# Patient Record
Sex: Female | Born: 1975
Health system: Southern US, Community
[De-identification: ages and names within clinical notes are randomized; demographics above are authoritative.]

## PROBLEM LIST (undated history)

## (undated) DIAGNOSIS — E119 Type 2 diabetes mellitus without complications: Secondary | ICD-10-CM

## (undated) DIAGNOSIS — K862 Cyst of pancreas: Secondary | ICD-10-CM

## (undated) HISTORY — PX: ABDOMINAL SURGERY: SHX537

## (undated) HISTORY — PX: PANCREATIC CYST EXCISION: SHX2157

---

## 2007-04-17 ENCOUNTER — Emergency Department (HOSPITAL_COMMUNITY): Admission: EM | Admit: 2007-04-17 | Discharge: 2007-04-17 | Payer: Self-pay | Admitting: Certified Registered"

## 2015-03-31 ENCOUNTER — Encounter (HOSPITAL_BASED_OUTPATIENT_CLINIC_OR_DEPARTMENT_OTHER): Payer: Self-pay | Admitting: Emergency Medicine

## 2015-03-31 ENCOUNTER — Emergency Department (HOSPITAL_BASED_OUTPATIENT_CLINIC_OR_DEPARTMENT_OTHER)
Admission: EM | Admit: 2015-03-31 | Discharge: 2015-03-31 | Disposition: A | Discharge status: Home / Self Care | Payer: BLUE CROSS/BLUE SHIELD | Attending: Emergency Medicine | Admitting: Emergency Medicine

## 2015-03-31 DIAGNOSIS — Y998 Other external cause status: Secondary | ICD-10-CM | POA: Insufficient documentation

## 2015-03-31 DIAGNOSIS — Y9389 Activity, other specified: Secondary | ICD-10-CM | POA: Diagnosis not present

## 2015-03-31 DIAGNOSIS — E119 Type 2 diabetes mellitus without complications: Secondary | ICD-10-CM | POA: Insufficient documentation

## 2015-03-31 DIAGNOSIS — Y92410 Unspecified street and highway as the place of occurrence of the external cause: Secondary | ICD-10-CM | POA: Insufficient documentation

## 2015-03-31 DIAGNOSIS — S4992XA Unspecified injury of left shoulder and upper arm, initial encounter: Secondary | ICD-10-CM

## 2015-03-31 DIAGNOSIS — Z7984 Long term (current) use of oral hypoglycemic drugs: Secondary | ICD-10-CM | POA: Insufficient documentation

## 2015-03-31 DIAGNOSIS — S79912A Unspecified injury of left hip, initial encounter: Secondary | ICD-10-CM | POA: Diagnosis not present

## 2015-03-31 HISTORY — DX: Type 2 diabetes mellitus without complications: E11.9

## 2015-03-31 MED ORDER — NAPROXEN 375 MG PO TABS: 375.0000 mg | ORAL_TABLET | Freq: Two times a day (BID) | ORAL | Status: DC

## 2015-03-31 MED ORDER — CYCLOBENZAPRINE HCL 10 MG PO TABS: 10.0000 mg | ORAL_TABLET | Freq: Two times a day (BID) | ORAL | Status: DC | PRN

## 2015-03-31 MED ORDER — IBUPROFEN 800 MG PO TABS
ORAL_TABLET | ORAL | Status: AC
  Administered 2015-03-31: 800 mg via ORAL
  Filled 2015-03-31: qty 1

## 2015-03-31 MED ORDER — IBUPROFEN 800 MG PO TABS
800.0000 mg | ORAL_TABLET | Freq: Once | ORAL | Status: AC
  Administered 2015-03-31: 800 mg via ORAL

## 2015-03-31 MED FILL — CYCLOBENZAPRINE 10 MG TAB: 10 | 10 days supply | Qty: 20 | Fill #0

## 2015-03-31 MED FILL — NAPROXEN 375 MG TABLET: 375 | 10 days supply | Qty: 20 | Fill #0
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
## 2015-03-31 NOTE — Discharge Instructions (Signed)
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
## 2015-12-14 ENCOUNTER — Emergency Department (HOSPITAL_BASED_OUTPATIENT_CLINIC_OR_DEPARTMENT_OTHER)
Admission: EM | Admit: 2015-12-14 | Discharge: 2015-12-14 | Disposition: A | Discharge status: Home / Self Care | Payer: BLUE CROSS/BLUE SHIELD | Attending: Emergency Medicine | Admitting: Emergency Medicine

## 2015-12-14 ENCOUNTER — Encounter (HOSPITAL_BASED_OUTPATIENT_CLINIC_OR_DEPARTMENT_OTHER): Payer: Self-pay | Admitting: *Deleted

## 2015-12-14 DIAGNOSIS — E119 Type 2 diabetes mellitus without complications: Secondary | ICD-10-CM | POA: Insufficient documentation

## 2015-12-14 DIAGNOSIS — M79602 Pain in left arm: Secondary | ICD-10-CM

## 2015-12-14 DIAGNOSIS — Z791 Long term (current) use of non-steroidal anti-inflammatories (NSAID): Secondary | ICD-10-CM | POA: Insufficient documentation

## 2015-12-14 DIAGNOSIS — Z7984 Long term (current) use of oral hypoglycemic drugs: Secondary | ICD-10-CM | POA: Insufficient documentation

## 2015-12-14 LAB — BASIC METABOLIC PANEL
Anion gap: 6 (ref 5–15)
BUN: 9 mg/dL (ref 6–20)
CHLORIDE: 104 mmol/L (ref 101–111)
CO2: 25 mmol/L (ref 22–32)
CREATININE: 0.58 mg/dL (ref 0.44–1.00)
Calcium: 9 mg/dL (ref 8.9–10.3)
GFR calc non Af Amer: >60 mL/min (ref >60)
Glucose, Bld: 152 mg/dL — ABNORMAL HIGH (ref 65–99)
POTASSIUM: 4.3 mmol/L (ref 3.5–5.1)
SODIUM: 135 mmol/L (ref 135–145)

## 2015-12-14 LAB — TROPONIN I

## 2015-12-14 LAB — MAGNESIUM: MAGNESIUM: 1.9 mg/dL (ref 1.7–2.4)

## 2015-12-14 LAB — HCG, QUANTITATIVE, PREGNANCY: hCG, Beta Chain, Quant, S: <1 m[IU]/mL (ref <5)

## 2015-12-14 MED ORDER — METHOCARBAMOL 500 MG PO TABS: 500.0000 mg | ORAL_TABLET | Freq: Two times a day (BID) | ORAL | 0 refills | Status: AC

## 2015-12-14 NOTE — ED Provider Notes (Signed)
Emergency Department Provider Note  By signing my name below, I, Sabrina Miles, attest that this documentation has been prepared under the direction and in the presence of Sabrina PlanJoshua G Elder Davidian, Miles . Electronically Signed: Nelwyn SalisburyJoshua Miles, Scribe. 12/14/2015. 6:17 PM.   I have reviewed the triage vital signs and the nursing notes.   HISTORY  Chief Complaint Arm Pain   HPI Sabrina Miles is a 40 y.o. female with pmhx of DM who presents to the Emergency Department complaining of sudden-onset gradually worsening intermittent left arm pain beginning 3 days ago. She describes her symptoms as a "spasming" pain that radiates from her left shoulder down to her left hand. When asked about the timing of her symptoms, she states that it has worsened today and occurs every 10 minutes. Pt reports associated tingling in her left fingertips. She denies any CP, SOB, changes in appetite, dysuria, trouble urinating, diarrhea, vomiting, neck pain, abdominal pain or numbness.  Past Medical History:  Diagnosis Date  . Diabetes mellitus without complication (HCC)     There are no active problems to display for this patient.   Past Surgical History:  Procedure Laterality Date  . ABDOMINAL SURGERY      Current Outpatient Rx  . Order #: [id number] Class: Print  . Order #: [id number] Class: Historical Med  . Order #: [id number] Class: Print  . Order #: [id number] Class: Print    Allergies Shellfish allergy  No family history on file.  Social History Social History  Substance Use Topics  . Smoking status: Never Smoker  . Smokeless tobacco: Never Used  . Alcohol use No    Review of Systems Constitutional: No fever/chills. No change in appetite. Eyes: No visual changes. ENT: No sore throat. Cardiovascular: Denies chest pain. Respiratory: Denies shortness of breath. Gastrointestinal: No abdominal pain.  No nausea, no vomiting.  No diarrhea.  No constipation.  Genitourinary: Negative for dysuria. Negative for  trouble urinating. Musculoskeletal: Positive myalgia (left arm). Negative for back pain. Skin: Negative for rash. Neurological:Positive tingling. Negative for neck pain. Negative for headaches, focal weakness or numbness. 10-point ROS otherwise negative.  ____________________________________________   PHYSICAL EXAM:  VITAL SIGNS: ED Triage Vitals  Enc Vitals Group     BP 12/14/15 1648 160/70     Pulse Rate 12/14/15 1648 90     Resp 12/14/15 1648 18     Temp 12/14/15 1648 98.1 F (36.7 C)     Temp Source 12/14/15 1648 Oral     SpO2 12/14/15 1648 100 %     Weight 12/14/15 1642 165 lb (74.8 kg)     Height 12/14/15 1642 5\' 8"  (1.727 m)     Pain Score 12/14/15 1643 10   Constitutional: Alert and oriented. Well appearing and in no acute distress. Eyes: Conjunctivae are normal.  Head: Atraumatic. Nose: No congestion/rhinnorhea. Mouth/Throat: Mucous membranes are moist.  Neck: No stridor. No cervical spine tenderness to palpation. Cardiovascular: Normal rate, regular rhythm. Good peripheral circulation. Grossly normal heart sounds.   Respiratory: Normal respiratory effort.  No retractions. Lungs CTAB. Gastrointestinal: Soft and nontender. No distention.  Musculoskeletal: No lower extremity tenderness nor edema. No gross deformities of extremities. Neurologic:  Normal speech and language. No gross focal neurologic deficits are appreciated.  Skin:  Skin is warm, dry and intact. No rash noted.   ____________________________________________   LABS (all labs ordered are listed, but only abnormal results are displayed)  Labs Reviewed  BASIC METABOLIC PANEL - Abnormal; Notable for the following:  Result Value   Glucose, Bld 152 (*)    All other components within normal limits  MAGNESIUM  TROPONIN I  HCG, QUANTITATIVE, PREGNANCY   ____________________________________________  EKG   EKG Interpretation  Date/Time:  Tuesday December 14 2015 16:48:08 EST Ventricular Rate:   97 PR Interval:  126 QRS Duration: 64 QT Interval:  338 QTC Calculation: 429 R Axis:   62 Text Interpretation:  Normal sinus rhythm Normal ECG No STEMI.  Confirmed by Sabrina Miles, Sabrina Miles [phone]) on 12/14/2015 4:50:42 PM Also confirmed by Sabrina Miles, Sabrina Miles [phone]), editor Wandalee FerdinandLOGAN, KIMBERLY [phone])  on 12/14/2015 5:01:12 PM       ____________________________________________   PROCEDURES  Procedure(s) performed:   Procedures  None ____________________________________________   INITIAL IMPRESSION / ASSESSMENT AND PLAN / ED COURSE  Pertinent labs & imaging results that were available during my care of the patient were reviewed by me and considered in my medical decision making (see chart for details).  Patient presents to the ED for evaluation of left arm spasm pain. Normal kidney function and electrolytes. No neurological deficits or vision changes to suggest new onset neurologic pathology (MS). Low suspicion for ACS with no CP or other anginal equivalents. EKG reviewed. No cervical spine injury or tenderness on exam. Plan for symptomatic treatment at home and PCP follow up.   At this time, I do not feel there is any life-threatening condition present. I have reviewed and discussed all results (EKG, imaging, lab, urine as appropriate), exam findings with patient. I have reviewed nursing notes and appropriate previous records.  I feel the patient is safe to be discharged home without further emergent workup. Discussed usual and customary return precautions. Patient and family (if present) verbalize understanding and are comfortable with this plan.  Patient will follow-up with their primary care provider. If they do not have a primary care provider, information for follow-up has been provided to them. All questions have been answered.  ____________________________________________  FINAL CLINICAL IMPRESSION(S) / ED DIAGNOSES  Final diagnoses:  Left arm pain     MEDICATIONS GIVEN DURING THIS  VISIT:  None  NEW OUTPATIENT MEDICATIONS STARTED DURING THIS VISIT:  Discharge Medication List as of 12/14/2015  7:47 PM    START taking these medications   Details  methocarbamol (ROBAXIN) 500 MG tablet Take 1 tablet (500 mg total) by mouth 2 (two) times daily., Starting Tue 12/14/2015, Print        Note:  This document was prepared using Dragon voice recognition software and may include unintentional dictation errors.  Alona BeneJoshua Lajuan Godbee, [facility]  I personally performed the services described in this documentation, which was scribed in my presence. The recorded information has been reviewed and is accurate.       Sabrina PlanJoshua G Riven Beebe, Miles 12/15/15 [phone]

## 2015-12-14 NOTE — Discharge Instructions (Signed)

## 2015-12-14 NOTE — ED Triage Notes (Signed)
crampy arm pain in her left arm x 3 days.

## 2017-01-25 ENCOUNTER — Emergency Department (HOSPITAL_BASED_OUTPATIENT_CLINIC_OR_DEPARTMENT_OTHER): Payer: 59

## 2017-01-25 ENCOUNTER — Other Ambulatory Visit: Payer: Self-pay

## 2017-01-25 ENCOUNTER — Emergency Department (HOSPITAL_BASED_OUTPATIENT_CLINIC_OR_DEPARTMENT_OTHER)
Admission: EM | Admit: 2017-01-25 | Discharge: 2017-01-25 | Disposition: A | Discharge status: Home / Self Care | Payer: 59 | Attending: Emergency Medicine | Admitting: Emergency Medicine

## 2017-01-25 ENCOUNTER — Encounter (HOSPITAL_BASED_OUTPATIENT_CLINIC_OR_DEPARTMENT_OTHER): Payer: Self-pay | Admitting: *Deleted

## 2017-01-25 DIAGNOSIS — M79602 Pain in left arm: Secondary | ICD-10-CM | POA: Diagnosis not present

## 2017-01-25 DIAGNOSIS — Z7984 Long term (current) use of oral hypoglycemic drugs: Secondary | ICD-10-CM | POA: Diagnosis not present

## 2017-01-25 DIAGNOSIS — E119 Type 2 diabetes mellitus without complications: Secondary | ICD-10-CM | POA: Insufficient documentation

## 2017-01-25 DIAGNOSIS — M25512 Pain in left shoulder: Secondary | ICD-10-CM | POA: Insufficient documentation

## 2017-01-25 DIAGNOSIS — R2232 Localized swelling, mass and lump, left upper limb: Secondary | ICD-10-CM | POA: Insufficient documentation

## 2017-01-25 DIAGNOSIS — Z79899 Other long term (current) drug therapy: Secondary | ICD-10-CM | POA: Diagnosis not present

## 2017-01-25 LAB — BASIC METABOLIC PANEL
Anion gap: 9 (ref 5–15)
BUN: 12 mg/dL (ref 6–20)
CALCIUM: 9.1 mg/dL (ref 8.9–10.3)
CO2: 24 mmol/L (ref 22–32)
Chloride: 99 mmol/L — ABNORMAL LOW (ref 101–111)
Creatinine, Ser: 0.71 mg/dL (ref 0.44–1.00)
GFR calc Af Amer: >60 mL/min (ref >60)
Glucose, Bld: 100 mg/dL — ABNORMAL HIGH (ref 65–99)
Potassium: 3.7 mmol/L (ref 3.5–5.1)
SODIUM: 132 mmol/L — AB (ref 135–145)

## 2017-01-25 LAB — CBG MONITORING, ED: Glucose-Capillary: 98 mg/dL (ref 65–99)

## 2017-01-25 LAB — CBC
HCT: 30.9 % — ABNORMAL LOW (ref 36.0–46.0)
Hemoglobin: 10.3 g/dL — ABNORMAL LOW (ref 12.0–15.0)
MCH: 27 pg (ref 26.0–34.0)
MCHC: 33.3 g/dL (ref 30.0–36.0)
MCV: 80.9 fL (ref 78.0–100.0)
PLATELETS: 507 10*3/uL — AB (ref 150–400)
RBC: 3.82 MIL/uL — AB (ref 3.87–5.11)
RDW: 16.3 % — AB (ref 11.5–15.5)
WBC: 10.5 10*3/uL (ref 4.0–10.5)

## 2017-01-25 LAB — PREGNANCY, URINE: PREG TEST UR: NEGATIVE

## 2017-01-25 MED ORDER — IOPAMIDOL (ISOVUE-370) INJECTION 76%
125.0000 mL | Freq: Once | INTRAVENOUS | Status: AC | PRN
  Administered 2017-01-25: 125 mL via INTRAVENOUS

## 2017-01-25 MED ORDER — NAPROXEN 375 MG PO TABS: 375.0000 mg | ORAL_TABLET | Freq: Two times a day (BID) | ORAL | 0 refills | Status: AC

## 2017-01-25 NOTE — ED Notes (Signed)
Pt in CT at this time.

## 2017-01-25 NOTE — Discharge Instructions (Signed)
Contact a health care provider if: °Your pain is getting worse. °Your pain is not relieved with medicines. °You lose function in the area of the pain if the pain is in your arms, legs, or neck. °

## 2017-01-25 NOTE — ED Notes (Signed)
Pt returned from CT °

## 2017-01-25 NOTE — ED Provider Notes (Signed)
[facility] EMERGENCY DEPARTMENT Provider Note   CSN: [id number] Arrival date & time: 01/25/17  1421     History   Chief Complaint Chief Complaint  Patient presents with  . Arm Pain    HPI Sabrina Miles Service is a 42 y.o. female.who presents with c/o left arm pain. Onset began 1 week ago. She had some pain in her left neck and shoulder that seemed to go away through the day. Later her arm began to ache and she feels as though the pain is radiating from her shoulder blade to her hand. It seems to come and go. It has been progressively worsening throughout the week.  Does not seem to be worse with any movement.  It seems to come and go and radiate up and down her arm.  She has noticed over the past couple days swelling and through her visit here in the ED her left hand has become pale cool and white.  She states that the pain is severe she denies any history of clotting disorders.  She did not take any exogenous estrogens.  She does not smoke she has not had no recent traumas, surgeries or long periods of confinement.  Patient also denies chest pain or shortness of breath she has not had any injuries to the left upper extremity or neck.  She denies numbness or tingling in the left upper extremity or hand  HPI  Past Medical History:  Diagnosis Date  . Diabetes mellitus without complication (HCC)     There are no active problems to display for this patient.   Past Surgical History:  Procedure Laterality Date  . ABDOMINAL SURGERY      OB History    No data available       Home Medications    Prior to Admission medications   Medication Sig Start Date End Date Taking? Authorizing Provider  cyclobenzaprine (FLEXERIL) 10 MG tablet Take 1 tablet (10 mg total) by mouth 2 (two) times daily as needed for muscle spasms. 03/31/15   Arthor CaptainHarris, Gionni Freese, PA-C  metFORMIN (GLUCOPHAGE) 500 MG tablet Take 500 mg by mouth 2 (two) times daily with a meal.    [provider]    methocarbamol (ROBAXIN) 500 MG tablet Take 1 tablet (500 mg total) by mouth 2 (two) times daily. 12/14/15   Long, Arlyss RepressJoshua G, MD  naproxen (NAPROSYN) 375 MG tablet Take 1 tablet (375 mg total) by mouth 2 (two) times daily. 03/31/15   Arthor CaptainHarris, Madai Nuccio, PA-C    Family History History reviewed. No pertinent family history.  Social History Social History   Tobacco Use  . Smoking status: Never Smoker  . Smokeless tobacco: Never Used  Substance Use Topics  . Alcohol use: No  . Drug use: No     Allergies   Shellfish allergy   Review of Systems Review of Systems Ten systems reviewed and are negative for acute change, except as noted in the HPI.    Physical Exam Updated Vital Signs BP 134/79   Pulse 80   Temp 98.4 F (36.9 C)   Resp 16   Ht 5' 7.5" (1.715 m)   Wt 74.8 kg (165 lb)   LMP 01/23/2017   SpO2 100%   BMI 25.46 kg/m   Physical Exam  Constitutional: She is oriented to person, place, and time. She appears well-developed and well-nourished. No distress.  HENT:  Head: Normocephalic and atraumatic.  Eyes: Conjunctivae are normal. No scleral icterus.  Neck: Normal range of motion.  Cardiovascular: Normal rate, regular rhythm and normal heart sounds. Exam reveals no gallop and no friction rub.  No murmur heard. Pulmonary/Chest: Effort normal and breath sounds normal. No respiratory distress.  Abdominal: Soft. Bowel sounds are normal. She exhibits no distension and no mass. There is no tenderness. There is no guarding.  Musculoskeletal:  Tenderness and swelling in the left bicep, trigger points in the left infraspinatus reproduce pain that radiates down the arm.  She also has tenderness and tightness in the left trapezius.  Normal range of motion of the left upper extremity.  The left bicep is swollen as compared to the right and her left hand is markedly pale with decreased capillary refill, extremely cool to touch comparatively to her right hand.  She has normal bilateral  radial pulses  Neurological: She is alert and oriented to person, place, and time.  Skin: Skin is warm and dry. She is not diaphoretic.  Psychiatric: Her behavior is normal.  Nursing note and vitals reviewed.    ED Treatments / Results  Labs (all labs ordered are listed, but only abnormal results are displayed) Labs Reviewed  CBC  BASIC METABOLIC PANEL  PREGNANCY, URINE    EKG  EKG Interpretation None       Radiology No results found.  Procedures Procedures (including critical care time)  Medications Ordered in ED Medications - No data to display   Initial Impression / Assessment and Plan / ED Course  I have reviewed the triage vital signs and the nursing notes.  Pertinent labs & imaging results that were available during my care of the patient were reviewed by me and considered in my medical decision making (see chart for details).     Patient's imaging negative for any acute abnormality. The patient Appears to have a likely musculoskeletal cause. Will treat with antiinflammatories.  Advised the patient to follow-up with her PCP she may need physical therapy for symptomatic relief.  She appears appropriate for discharge at this time  Final Clinical Impressions(s) / ED Diagnoses   Final diagnoses:  Left arm pain    ED Discharge Orders    None       Arthor Captain, PA-C 01/26/17 1610    Tegeler, Canary Brim, MD 01/26/17 [phone]

## 2017-01-25 NOTE — ED Triage Notes (Addendum)
p c/o left arm pain which radiates down to finger tips x 1 week off and on. Pt denies CP or SOB

## 2019-06-30 ENCOUNTER — Emergency Department (HOSPITAL_BASED_OUTPATIENT_CLINIC_OR_DEPARTMENT_OTHER): Payer: BLUE CROSS/BLUE SHIELD

## 2019-06-30 ENCOUNTER — Emergency Department (HOSPITAL_BASED_OUTPATIENT_CLINIC_OR_DEPARTMENT_OTHER)
Admission: EM | Admit: 2019-06-30 | Discharge: 2019-06-30 | Disposition: A | Discharge status: Home / Self Care | Payer: BLUE CROSS/BLUE SHIELD | Attending: Emergency Medicine | Admitting: Emergency Medicine

## 2019-06-30 ENCOUNTER — Encounter (HOSPITAL_BASED_OUTPATIENT_CLINIC_OR_DEPARTMENT_OTHER): Payer: Self-pay | Admitting: Emergency Medicine

## 2019-06-30 ENCOUNTER — Other Ambulatory Visit: Payer: Self-pay

## 2019-06-30 DIAGNOSIS — Z7984 Long term (current) use of oral hypoglycemic drugs: Secondary | ICD-10-CM | POA: Insufficient documentation

## 2019-06-30 DIAGNOSIS — Z20822 Contact with and (suspected) exposure to covid-19: Secondary | ICD-10-CM | POA: Insufficient documentation

## 2019-06-30 DIAGNOSIS — R079 Chest pain, unspecified: Secondary | ICD-10-CM | POA: Diagnosis not present

## 2019-06-30 DIAGNOSIS — R0602 Shortness of breath: Secondary | ICD-10-CM | POA: Diagnosis present

## 2019-06-30 DIAGNOSIS — E119 Type 2 diabetes mellitus without complications: Secondary | ICD-10-CM | POA: Insufficient documentation

## 2019-06-30 HISTORY — DX: Cyst of pancreas: K86.2

## 2019-06-30 LAB — CBC
HCT: 32.8 % — ABNORMAL LOW (ref 36.0–46.0)
Hemoglobin: 10.3 g/dL — ABNORMAL LOW (ref 12.0–15.0)
MCH: 26.9 pg (ref 26.0–34.0)
MCHC: 31.4 g/dL (ref 30.0–36.0)
MCV: 85.6 fL (ref 80.0–100.0)
Platelets: 534 10*3/uL — ABNORMAL HIGH (ref 150–400)
RBC: 3.83 MIL/uL — ABNORMAL LOW (ref 3.87–5.11)
RDW: 14.1 % (ref 11.5–15.5)
WBC: 7.3 10*3/uL (ref 4.0–10.5)
nRBC: 0 % (ref 0.0–0.2)

## 2019-06-30 LAB — BASIC METABOLIC PANEL
Anion gap: 9 (ref 5–15)
BUN: 14 mg/dL (ref 6–20)
CO2: 24 mmol/L (ref 22–32)
Calcium: 8.7 mg/dL — ABNORMAL LOW (ref 8.9–10.3)
Chloride: 100 mmol/L (ref 98–111)
Creatinine, Ser: 0.64 mg/dL (ref 0.44–1.00)
GFR calc Af Amer: >60 mL/min (ref >60)
GFR calc non Af Amer: >60 mL/min (ref >60)
Glucose, Bld: 300 mg/dL — ABNORMAL HIGH (ref 70–99)
Potassium: 4.2 mmol/L (ref 3.5–5.1)
Sodium: 133 mmol/L — ABNORMAL LOW (ref 135–145)

## 2019-06-30 LAB — SARS CORONAVIRUS 2 BY RT PCR (HOSPITAL ORDER, PERFORMED IN ~~LOC~~ HOSPITAL LAB): SARS Coronavirus 2: NEGATIVE

## 2019-06-30 LAB — TROPONIN I (HIGH SENSITIVITY): Troponin I (High Sensitivity): <2 ng/L (ref <18)

## 2019-06-30 LAB — D-DIMER, QUANTITATIVE: D-Dimer, Quant: 0.47 ug/mL-FEU (ref 0.00–0.50)

## 2019-06-30 NOTE — ED Provider Notes (Signed)
[facility] EMERGENCY DEPARTMENT Provider Note   CSN: [id number] Arrival date & time: 06/30/19  5809     History Chief Complaint  Patient presents with  . Chest Pain    Sabrina Miles is a 44 y.o. female history of diabetes presented emerge department chest pain shortness of breath.  The patient reports that she woke up this morning feeling fine, did not eat breakfast, while driving into work and began having sudden onset of centralized chest pressure and shortness of breath, associated with lightheadedness.  Originally the intensity was 7 out of 10 with her pressure, since arriving in the ER it is now 3 out of 10.  She has never had these kind of symptoms before.  The pressure does not radiate anywhere.  It located midsternal.  She has some mild dyspnea.  She does report bilateral leg swelling a week ago which resolved.  She denies any active leg swelling or pain.  She denies any known history of DVT or PE.  She reports continues to have some mild lightheadedness.  She denies any history of pulmonary disease or smoking.  She denies that she received a Covid vaccines.  She reports she does have a history of indigestion and acid reflux, but does not feel like this is similar to that.  She did not eat anything or drink any coffee this morning  HPI     Past Medical History:  Diagnosis Date  . Diabetes mellitus without complication (HCC)   . Pancreatic cyst     There are no problems to display for this patient.   Past Surgical History:  Procedure Laterality Date  . ABDOMINAL SURGERY    . PANCREATIC CYST EXCISION       OB History   No obstetric history on file.     No family history on file.  Social History   Tobacco Use  . Smoking status: Never Smoker  . Smokeless tobacco: Never Used  Substance Use Topics  . Alcohol use: No  . Drug use: No    Home Medications Prior to Admission medications   Medication Sig Start Date End Date Taking? Authorizing  Provider  cyclobenzaprine (FLEXERIL) 10 MG tablet Take 1 tablet (10 mg total) by mouth 2 (two) times daily as needed for muscle spasms. 03/31/15   Arthor Captain, PA-C  metFORMIN (GLUCOPHAGE) 500 MG tablet Take 500 mg by mouth 2 (two) times daily with a meal.    [provider]  methocarbamol (ROBAXIN) 500 MG tablet Take 1 tablet (500 mg total) by mouth 2 (two) times daily. 12/14/15   Long, Arlyss Repress, MD  naproxen (NAPROSYN) 375 MG tablet Take 1 tablet (375 mg total) by mouth 2 (two) times daily. 01/25/17   Arthor Captain, PA-C    Allergies    Shellfish allergy  Review of Systems   Review of Systems  Constitutional: Negative for chills and fever.  HENT: Negative for ear pain and sore throat.   Eyes: Negative for pain and visual disturbance.  Respiratory: Positive for shortness of breath. Negative for cough.   Cardiovascular: Positive for chest pain and leg swelling. Negative for palpitations.  Gastrointestinal: Negative for abdominal pain and vomiting.  Genitourinary: Negative for dysuria and hematuria.  Musculoskeletal: Negative for arthralgias and back pain.  Skin: Negative for color change and rash.  Neurological: Positive for light-headedness. Negative for syncope.  Psychiatric/Behavioral: Negative for agitation and confusion.  All other systems reviewed and are negative.   Physical Exam Updated Vital Signs BP Marland Kitchen)  146/71   Pulse 99   Temp 98.1 F (36.7 C) (Oral)   Resp 14   Ht 5\' 8"  (1.727 m)   Wt 74.8 kg   LMP 06/26/2019   SpO2 100%   BMI 25.09 kg/m   Physical Exam Vitals and nursing note reviewed.  Constitutional:      General: She is not in acute distress.    Appearance: She is well-developed.  HENT:     Head: Normocephalic and atraumatic.  Eyes:     Conjunctiva/sclera: Conjunctivae normal.  Cardiovascular:     Rate and Rhythm: Normal rate and regular rhythm.     Heart sounds: No murmur heard.   Pulmonary:     Effort: Pulmonary effort is normal. No  respiratory distress.     Breath sounds: Normal breath sounds.  Abdominal:     Palpations: Abdomen is soft.     Tenderness: There is no abdominal tenderness.  Musculoskeletal:     Cervical back: Neck supple.  Skin:    General: Skin is warm and dry.     Capillary Refill: Capillary refill takes less than 2 seconds.  Neurological:     General: No focal deficit present.     Mental Status: She is alert and oriented to person, place, and time.  Psychiatric:        Mood and Affect: Mood normal.        Behavior: Behavior normal.     ED Results / Procedures / Treatments   Labs (all labs ordered are listed, but only abnormal results are displayed) Labs Reviewed  CBC - Abnormal; Notable for the following components:      Result Value   RBC 3.83 (*)    Hemoglobin 10.3 (*)    HCT 32.8 (*)    Platelets 534 (*)    All other components within normal limits  BASIC METABOLIC PANEL  PREGNANCY, URINE  TROPONIN I (HIGH SENSITIVITY)    EKG EKG Interpretation  Date/Time:  Monday June 30 2019 08:32:46 EDT Ventricular Rate:  96 PR Interval:    QRS Duration: 72 QT Interval:  353 QTC Calculation: 447 R Axis:   59 Text Interpretation: Sinus rhythm No STEMI Confirmed by [id number] [phone]) on 06/30/2019 8:57:02 AM   Radiology No results found.  Procedures Procedures (including critical care time)  Medications Ordered in ED Medications - No data to display  ED Course  I have reviewed the triage vital signs and the nursing notes.  Pertinent labs & imaging results that were available during my care of the patient were reviewed by me and considered in my medical decision making (see chart for details).  44 yo female here with chest pain since this morning  DDx includes gastritis vs reflux vs ACS vs PE vs other  Labs ordered and personally reviewed with negative DDimer, undetectable troponin (<2), covid negative, CBC with no leukocytosis (WBC 7.3), BMP notable only for  hyperglycemia Xray chest personally reviewed showing no pneumothorax, no focal consolidations suggestive of PNA  ECG per my interpretation with NSR, no STEMI  Based on this workup, her clinical exam, and vitals, I felt it was unlikely that the patient had ACS, PE, PTX, PNA, or significant medical emergency.  Her HEART score is < 3.  Her symptoms may be related to reflux or another cause of chest pain.    Clinical Course as of Jun 30 1731  Mon Jun 30, 2019  0957 D-Dimer, Quant: 0.47 [MT]  0957 Troponin I (High Sensitivity): <2 [  MT]  1028 Symptoms largely improved on their own since her arrival.  Discussed her work-up which is very reassuring.  Given her diabetes, I do think it is reasonable for her to see a cardiologist, and will provide that information for them.  However at this time I think this is less likely be an acute coronary syndrome or pulmonary embolism.  Okay for discharge   [MT]    Clinical Course User Index [MT] Khyrin Trevathan, Carola Rhine, MD    Final Clinical Impression(s) / ED Diagnoses Final diagnoses:  Chest pain    Rx / DC Orders ED Discharge Orders    None       Langston Masker Carola Rhine, MD 06/30/19 1736
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
## 2020-10-05 ENCOUNTER — Emergency Department (HOSPITAL_BASED_OUTPATIENT_CLINIC_OR_DEPARTMENT_OTHER): Payer: No Typology Code available for payment source

## 2020-10-05 ENCOUNTER — Emergency Department (HOSPITAL_BASED_OUTPATIENT_CLINIC_OR_DEPARTMENT_OTHER)
Admission: EM | Admit: 2020-10-05 | Discharge: 2020-10-05 | Disposition: A | Discharge status: Home / Self Care | Payer: No Typology Code available for payment source | Attending: Emergency Medicine | Admitting: Emergency Medicine

## 2020-10-05 ENCOUNTER — Other Ambulatory Visit: Payer: Self-pay

## 2020-10-05 ENCOUNTER — Encounter (HOSPITAL_BASED_OUTPATIENT_CLINIC_OR_DEPARTMENT_OTHER): Payer: Self-pay | Admitting: Urology

## 2020-10-05 DIAGNOSIS — Z7984 Long term (current) use of oral hypoglycemic drugs: Secondary | ICD-10-CM | POA: Diagnosis not present

## 2020-10-05 DIAGNOSIS — T148XXA Other injury of unspecified body region, initial encounter: Secondary | ICD-10-CM

## 2020-10-05 DIAGNOSIS — M546 Pain in thoracic spine: Secondary | ICD-10-CM | POA: Insufficient documentation

## 2020-10-05 DIAGNOSIS — Y92410 Unspecified street and highway as the place of occurrence of the external cause: Secondary | ICD-10-CM | POA: Insufficient documentation

## 2020-10-05 DIAGNOSIS — M25512 Pain in left shoulder: Secondary | ICD-10-CM | POA: Diagnosis not present

## 2020-10-05 DIAGNOSIS — S199XXA Unspecified injury of neck, initial encounter: Secondary | ICD-10-CM | POA: Diagnosis present

## 2020-10-05 DIAGNOSIS — S161XXA Strain of muscle, fascia and tendon at neck level, initial encounter: Secondary | ICD-10-CM | POA: Insufficient documentation

## 2020-10-05 DIAGNOSIS — E119 Type 2 diabetes mellitus without complications: Secondary | ICD-10-CM | POA: Insufficient documentation

## 2020-10-05 MED ORDER — CYCLOBENZAPRINE HCL 10 MG PO TABS: 10.0000 mg | ORAL_TABLET | Freq: Two times a day (BID) | ORAL | 0 refills | Status: AC | PRN

## 2020-10-05 MED ORDER — OXYCODONE-ACETAMINOPHEN 5-325 MG PO TABS
1.0000 | ORAL_TABLET | Freq: Once | ORAL | Status: AC
  Administered 2020-10-05: 1 via ORAL
  Filled 2020-10-05: qty 1

## 2020-10-05 MED ORDER — IBUPROFEN 600 MG PO TABS: 600.0000 mg | ORAL_TABLET | Freq: Three times a day (TID) | ORAL | 0 refills | Status: AC | PRN
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
## 2020-10-05 NOTE — Discharge Instructions (Signed)
Take Tylenol and Motrin as needed for pain control.  Can also try a muscle relaxer.  Come back to ER if you develop worsening pain, difficulty breathing or other new concerning symptom.

## 2020-10-06 ENCOUNTER — Telehealth (HOSPITAL_BASED_OUTPATIENT_CLINIC_OR_DEPARTMENT_OTHER): Payer: Self-pay | Admitting: Emergency Medicine
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
## 2022-09-05 ENCOUNTER — Other Ambulatory Visit: Payer: Self-pay | Admitting: Physician Assistant

## 2022-09-05 DIAGNOSIS — E041 Nontoxic single thyroid nodule: Secondary | ICD-10-CM
